# Patient Record
Sex: Male | Born: 1975 | Race: White | Hispanic: No | Marital: Single
Health system: Southern US, Community
[De-identification: ages and names within clinical notes are randomized; demographics above are authoritative.]

---

## 2015-06-14 ENCOUNTER — Other Ambulatory Visit: Payer: Self-pay | Admitting: Family Medicine

## 2015-06-14 DIAGNOSIS — E049 Nontoxic goiter, unspecified: Secondary | ICD-10-CM

## 2015-06-24 ENCOUNTER — Ambulatory Visit
Admission: RE | Admit: 2015-06-24 | Discharge: 2015-06-24 | Disposition: A | Discharge status: Home / Self Care | Payer: No Typology Code available for payment source | Source: Ambulatory Visit | Attending: Family Medicine | Admitting: Family Medicine

## 2015-06-24 DIAGNOSIS — E049 Nontoxic goiter, unspecified: Secondary | ICD-10-CM

## 2015-08-13 ENCOUNTER — Other Ambulatory Visit (HOSPITAL_COMMUNITY): Payer: Self-pay | Admitting: *Deleted

## 2015-08-14 ENCOUNTER — Encounter (HOSPITAL_COMMUNITY)
Admission: RE | Admit: 2015-08-14 | Discharge: 2015-08-14 | Disposition: A | Discharge status: Home / Self Care | Payer: BLUE CROSS/BLUE SHIELD | Source: Ambulatory Visit | Attending: Family Medicine | Admitting: Family Medicine

## 2015-08-14 MED ORDER — LIDOCAINE HCL (PF) 1 % IJ SOLN: 2.0000 mL | INTRAMUSCULAR | Status: DC

## 2015-08-14 NOTE — Progress Notes (Signed)
Left AC phlebotomy performed, removed 500cc and pt tolerated procedure well.  VSS upon DC

## 2015-08-28 ENCOUNTER — Encounter (HOSPITAL_COMMUNITY)
Admission: RE | Admit: 2015-08-28 | Discharge: 2015-08-28 | Disposition: A | Discharge status: Home / Self Care | Payer: BLUE CROSS/BLUE SHIELD | Source: Ambulatory Visit | Attending: Family Medicine | Admitting: Family Medicine

## 2015-08-28 LAB — POCT HEMOGLOBIN-HEMACUE: HEMOGLOBIN: 14.6 g/dL (ref 13.0–17.0)

## 2015-08-28 MED ORDER — LIDOCAINE HCL (PF) 1 % IJ SOLN: 2.0000 mL | INTRAMUSCULAR | Status: DC

## 2015-08-28 NOTE — Progress Notes (Signed)
hemocue 14.6 today.  Phlebotomized 500cc from left AC and pt tolerated procedure well. VSS upon DC

## 2015-09-11 ENCOUNTER — Encounter (HOSPITAL_COMMUNITY)
Admission: RE | Admit: 2015-09-11 | Discharge: 2015-09-11 | Disposition: A | Discharge status: Home / Self Care | Payer: BLUE CROSS/BLUE SHIELD | Source: Ambulatory Visit | Attending: Family Medicine | Admitting: Family Medicine

## 2015-09-11 MED ORDER — LIDOCAINE HCL (PF) 1 % IJ SOLN: 2.0000 mL | INTRAMUSCULAR | Status: DC

## 2015-09-11 NOTE — Progress Notes (Signed)
Pt came in today for scheduled therapeutic phlebotomy.  Pt's HemoCue prior to procedure was 15.1.Marland Kitchen Left AC was used.  500 cc was removed per MD order.  Pt tolerated procedure well.

## 2015-09-12 LAB — POCT HEMOGLOBIN-HEMACUE: HEMOGLOBIN: 15.1 g/dL (ref 13.0–17.0)

## 2015-09-24 ENCOUNTER — Other Ambulatory Visit (HOSPITAL_COMMUNITY): Payer: Self-pay | Admitting: *Deleted

## 2015-09-25 ENCOUNTER — Encounter (HOSPITAL_COMMUNITY)
Admission: RE | Admit: 2015-09-25 | Discharge: 2015-09-25 | Disposition: A | Discharge status: Home / Self Care | Payer: BLUE CROSS/BLUE SHIELD | Source: Ambulatory Visit | Attending: Family Medicine | Admitting: Family Medicine

## 2015-09-25 LAB — POCT HEMOGLOBIN-HEMACUE: Hemoglobin: 14.8 g/dL (ref 13.0–17.0)

## 2015-09-25 MED ORDER — LIDOCAINE HCL (PF) 1 % IJ SOLN: 2.0000 mL | INTRAMUSCULAR | Status: DC

## 2015-09-25 NOTE — Progress Notes (Signed)
Pt came into day for scheduled therapeutic phlebotomy.  Pt's HemoCue was 14.8 prior to procedure.  500 cc of blood was removed per MD order and per hospital protocol.  Pt tolerated procedure well.

## 2015-12-24 ENCOUNTER — Other Ambulatory Visit (HOSPITAL_COMMUNITY): Payer: Self-pay | Admitting: *Deleted

## 2015-12-25 ENCOUNTER — Encounter (HOSPITAL_COMMUNITY)
Admission: RE | Admit: 2015-12-25 | Discharge: 2015-12-25 | Disposition: A | Discharge status: Home / Self Care | Payer: BLUE CROSS/BLUE SHIELD | Source: Ambulatory Visit | Attending: Family Medicine | Admitting: Family Medicine

## 2015-12-25 LAB — POCT HEMOGLOBIN-HEMACUE: HEMOGLOBIN: 14.7 g/dL (ref 13.0–17.0)

## 2015-12-25 MED ORDER — LIDOCAINE HCL (PF) 1 % IJ SOLN: 2.0000 mL | Freq: Every day | INTRAMUSCULAR | Status: DC | PRN

## 2015-12-25 NOTE — Progress Notes (Signed)
PT came in today for scheduled therapeutic phlebotomy.  Pt's HemoCue prior to arrival was 14.7  500 cc was removed per MD order and per hospital Protocol.  Pt tolerated procedure well.  Will continue to monitor

## 2016-01-07 ENCOUNTER — Other Ambulatory Visit (HOSPITAL_COMMUNITY): Payer: Self-pay | Admitting: *Deleted

## 2016-01-08 ENCOUNTER — Encounter (HOSPITAL_COMMUNITY)
Admission: RE | Admit: 2016-01-08 | Discharge: 2016-01-08 | Disposition: A | Discharge status: Home / Self Care | Payer: BLUE CROSS/BLUE SHIELD | Source: Ambulatory Visit | Attending: Family Medicine | Admitting: Family Medicine

## 2016-01-08 LAB — POCT HEMOGLOBIN-HEMACUE: Hemoglobin: 15.2 g/dL (ref 13.0–17.0)

## 2016-01-08 MED ORDER — LIDOCAINE HCL (PF) 1 % IJ SOLN: 2.0000 mL | Freq: Every day | INTRAMUSCULAR | Status: DC | PRN

## 2016-01-22 ENCOUNTER — Encounter (HOSPITAL_COMMUNITY)
Admission: RE | Admit: 2016-01-22 | Discharge: 2016-01-22 | Disposition: A | Discharge status: Home / Self Care | Payer: BLUE CROSS/BLUE SHIELD | Source: Ambulatory Visit | Attending: Family Medicine | Admitting: Family Medicine

## 2016-01-22 LAB — POCT HEMOGLOBIN-HEMACUE: Hemoglobin: 14.9 g/dL (ref 13.0–17.0)

## 2016-01-22 MED ORDER — LIDOCAINE HCL (PF) 1 % IJ SOLN: 2.0000 mL | Freq: Every day | INTRAMUSCULAR | Status: DC | PRN

## 2016-01-22 NOTE — Progress Notes (Signed)
PT came in today for scheduled therapeutic phlebotomy.  Pt's HemoCue was checked prior to procedure  per MD order.  IT was 14.9  500 cc was removed per MD order and per hospital protocol.  PT tolerated procedure well.  HE stated that the needle hurt coming out today but he was ok.  Will continue to monitor closely

## 2016-02-05 ENCOUNTER — Encounter (HOSPITAL_COMMUNITY)
Admission: RE | Admit: 2016-02-05 | Discharge: 2016-02-05 | Disposition: A | Discharge status: Home / Self Care | Payer: BLUE CROSS/BLUE SHIELD | Source: Ambulatory Visit | Attending: Family Medicine | Admitting: Family Medicine

## 2016-02-05 LAB — POCT HEMOGLOBIN-HEMACUE: HEMOGLOBIN: 14.7 g/dL (ref 13.0–17.0)

## 2016-02-05 MED ORDER — LIDOCAINE HCL (PF) 1 % IJ SOLN: 2.0000 mL | Freq: Every day | INTRAMUSCULAR | Status: DC | PRN

## 2016-02-05 NOTE — Progress Notes (Signed)
Hgb. Today 14.7; phlebotomy completed left antecub; pt. Tolerated well

## 2016-11-03 IMAGING — US US SOFT TISSUE HEAD/NECK
1 series · 13 of 25 positions shown · non-contrast
Comparison: None.

CLINICAL DATA: Enlarged thyroid gland by physical exam

EXAM:
THYROID ULTRASOUND
TECHNIQUE: Ultrasound examination of the thyroid gland and adjacent soft
tissues was performed.

[Series 1: us soft tissue head/neck · 0.10mm/px · 13 of 63 slices shown]
[im 1/63]
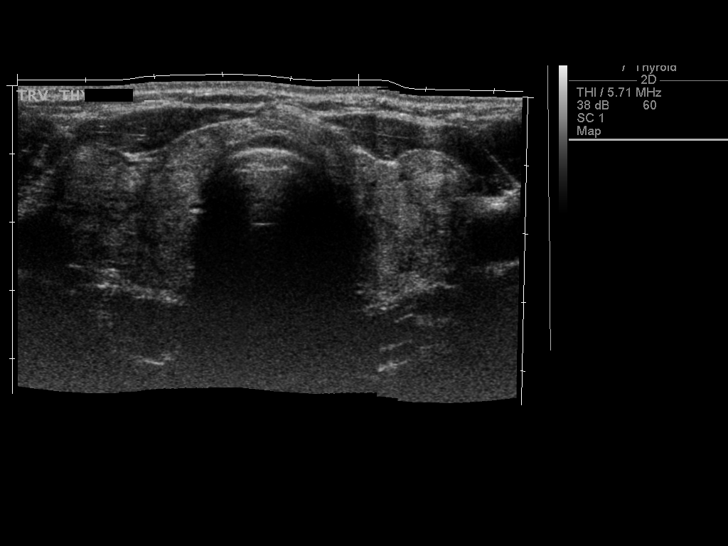
[im 6/63]
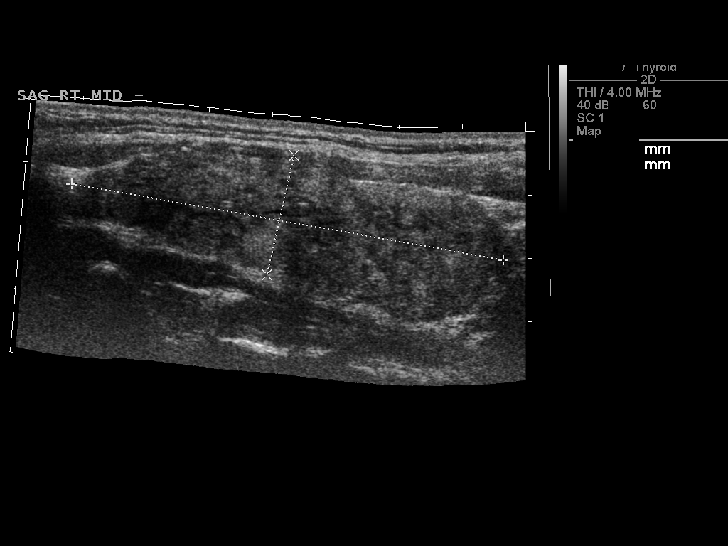
[im 11/63]
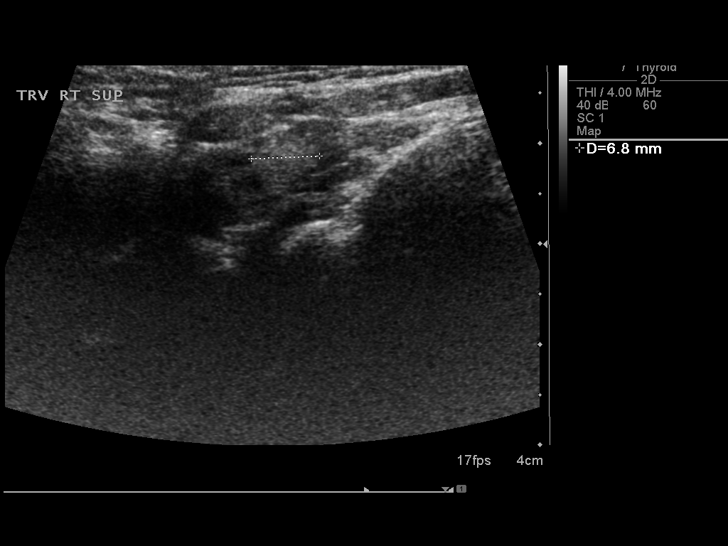
[im 16/63]
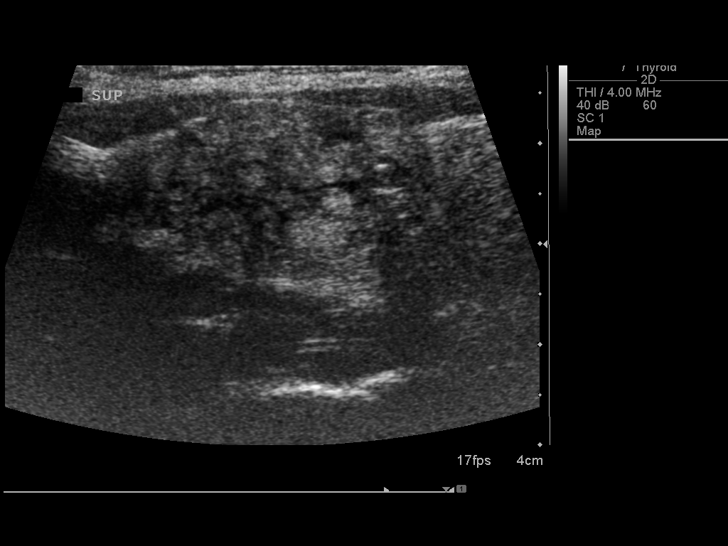
[im 21/63]
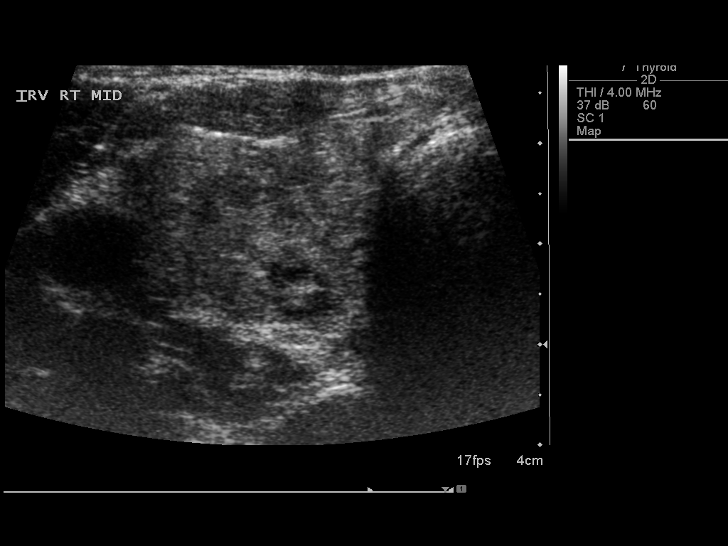
[im 26/63]
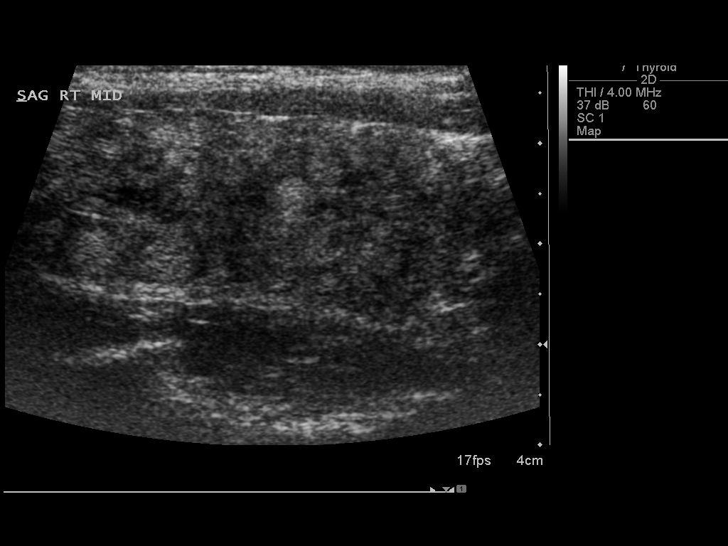
[im 32/63]
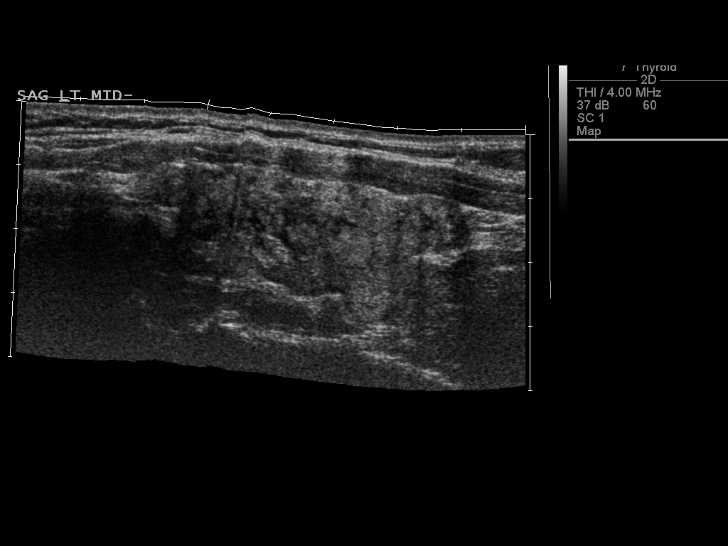
[im 37/63]
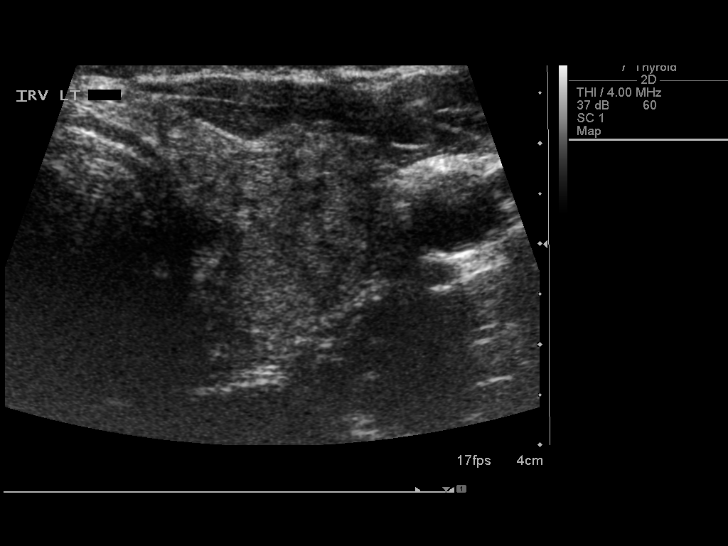
[im 42/63]
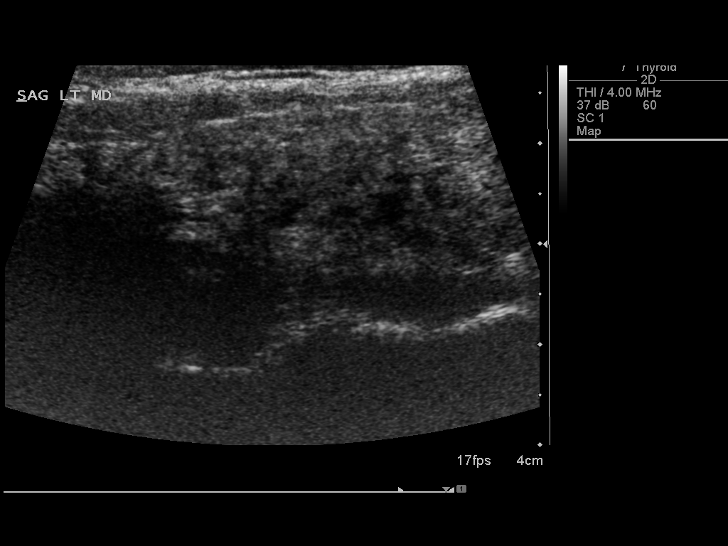
[im 47/63]
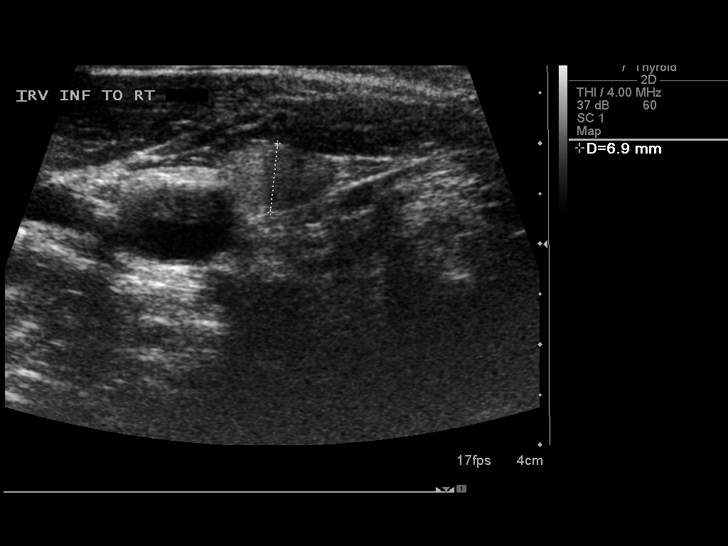
[im 52/63]
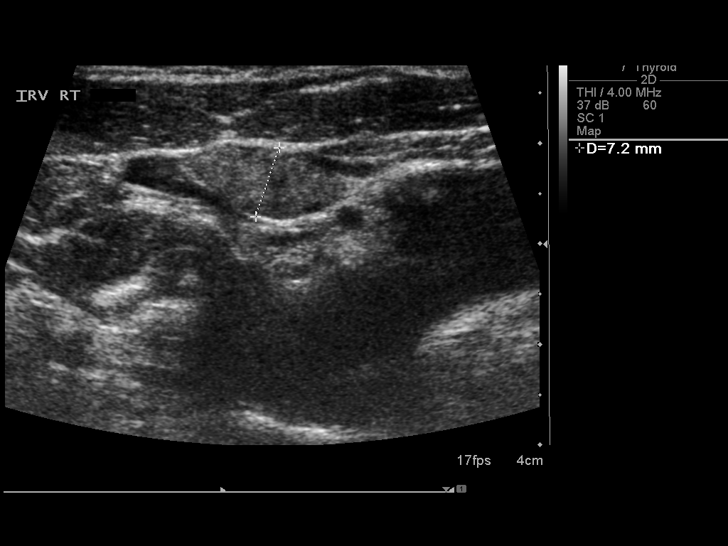
[im 57/63]
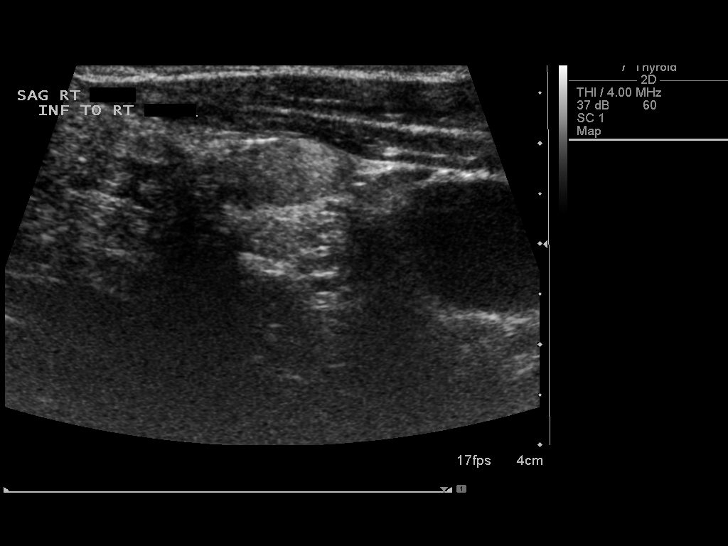
[im 63/63]
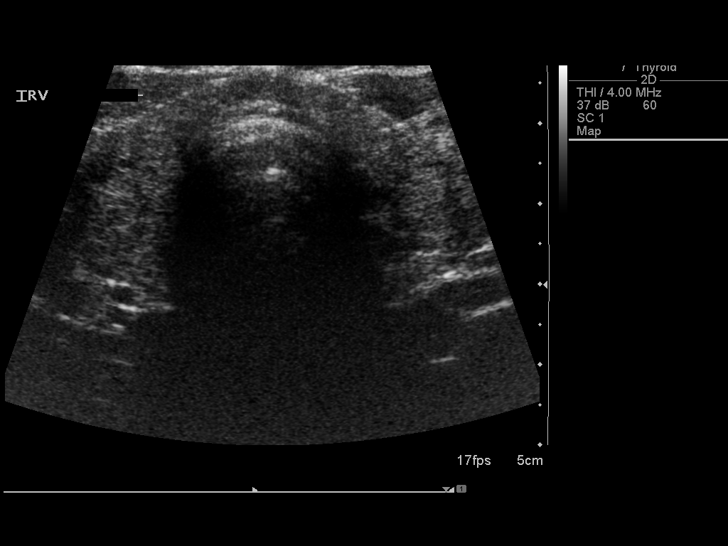

[13 of 25 positions shown; findings below may reference images not displayed]

FINDINGS: Right thyroid lobe

Measurements: 6.9 x 1.9 x 2.7 cm. Multiple nodules are scattered
throughout the right lobe. The largest is in the upper pole and is
ill-defined, measuring 1.1 x 1.1 x 1.2 cm. The gland is markedly
heterogeneous.

Left thyroid lobe

Measurements: 5.6 X 2.4 X 2.5 CM.  No nodules visualized.

Isthmus

Thickness: 5 MM.  No nodules visualized.

Lymphadenopathy

Small scattered lymph nodes are present. There is a nonspecific
x 0.7 x 0.8 mm soft tissue mass inferior to the right lobe of the
thyroid gland. There is no fatty hilum. There is internal
vascularity.
IMPRESSION: The gland is heterogeneous and enlarged.

Dominant thyroid nodule in the upper pole of the right lobe is
ill-defined and measures 1.2 cm. Other smaller nodules are present.
Findings do not meet current SRU consensus criteria for biopsy.
Follow-up by clinical exam is recommended. If patient has known risk
factors for thyroid carcinoma, consider follow-up ultrasound in 12
months. If patient is clinically hyperthyroid, consider nuclear
medicine thyroid uptake and scan.

Reference: Management of Thyroid Nodules Detected at US: Society of
Radiologists in Ultrasound Consensus Conference Statement. Radiology
0884; [DATE].

There is a nonspecific soft tissue mass measuring 1.7 cm inferior to
the right lobe of the thyroid gland. This does not appear as a
normal lymph node. Abnormal adenopathy cannot be excluded.

## 2019-08-07 ENCOUNTER — Ambulatory Visit: Payer: No Typology Code available for payment source | Attending: Internal Medicine

## 2019-08-07 DIAGNOSIS — Z20822 Contact with and (suspected) exposure to covid-19: Secondary | ICD-10-CM

## 2019-08-09 LAB — NOVEL CORONAVIRUS, NAA: SARS-CoV-2, NAA: NOT DETECTED
